# Patient Record
Sex: Female | Born: 1950 | Race: Asian | Hispanic: No
Health system: Southern US, Community
[De-identification: ages and names within clinical notes are randomized; demographics above are authoritative.]

## PROBLEM LIST (undated history)

## (undated) DIAGNOSIS — I10 Essential (primary) hypertension: Secondary | ICD-10-CM

## (undated) DIAGNOSIS — E119 Type 2 diabetes mellitus without complications: Secondary | ICD-10-CM

---

## 2019-03-20 ENCOUNTER — Emergency Department: Payer: PRIVATE HEALTH INSURANCE

## 2019-03-20 ENCOUNTER — Encounter: Payer: Self-pay | Admitting: Radiology

## 2019-03-20 ENCOUNTER — Other Ambulatory Visit: Payer: Self-pay

## 2019-03-20 ENCOUNTER — Ambulatory Visit (HOSPITAL_COMMUNITY)
Admission: EM | Admit: 2019-03-20 | Discharge: 2019-03-20 | Disposition: A | Discharge status: Home / Self Care | Payer: Self-pay

## 2019-03-20 ENCOUNTER — Emergency Department
Admission: EM | Admit: 2019-03-20 | Discharge: 2019-03-21 | Disposition: A | Discharge status: Home / Self Care | Payer: PRIVATE HEALTH INSURANCE | Attending: Emergency Medicine | Admitting: Emergency Medicine

## 2019-03-20 DIAGNOSIS — R42 Dizziness and giddiness: Secondary | ICD-10-CM | POA: Diagnosis not present

## 2019-03-20 DIAGNOSIS — N390 Urinary tract infection, site not specified: Secondary | ICD-10-CM | POA: Diagnosis not present

## 2019-03-20 DIAGNOSIS — H539 Unspecified visual disturbance: Secondary | ICD-10-CM | POA: Insufficient documentation

## 2019-03-20 DIAGNOSIS — R112 Nausea with vomiting, unspecified: Secondary | ICD-10-CM | POA: Diagnosis not present

## 2019-03-20 DIAGNOSIS — R51 Headache: Secondary | ICD-10-CM | POA: Diagnosis not present

## 2019-03-20 DIAGNOSIS — E119 Type 2 diabetes mellitus without complications: Secondary | ICD-10-CM | POA: Diagnosis not present

## 2019-03-20 DIAGNOSIS — I10 Essential (primary) hypertension: Secondary | ICD-10-CM | POA: Diagnosis not present

## 2019-03-20 DIAGNOSIS — R519 Headache, unspecified: Secondary | ICD-10-CM

## 2019-03-20 HISTORY — DX: Type 2 diabetes mellitus without complications: E11.9

## 2019-03-20 HISTORY — DX: Essential (primary) hypertension: I10

## 2019-03-20 LAB — BASIC METABOLIC PANEL
Anion gap: 11 (ref 5–15)
BUN: 15 mg/dL (ref 8–23)
CO2: 25 mmol/L (ref 22–32)
Calcium: 9.3 mg/dL (ref 8.9–10.3)
Chloride: 96 mmol/L — ABNORMAL LOW (ref 98–111)
Creatinine, Ser: 0.86 mg/dL (ref 0.44–1.00)
GFR calc Af Amer: >60 mL/min (ref >60)
GFR calc non Af Amer: >60 mL/min (ref >60)
Glucose, Bld: 141 mg/dL — ABNORMAL HIGH (ref 70–99)
Potassium: 4.1 mmol/L (ref 3.5–5.1)
Sodium: 132 mmol/L — ABNORMAL LOW (ref 135–145)

## 2019-03-20 LAB — URINALYSIS, COMPLETE (UACMP) WITH MICROSCOPIC
Bacteria, UA: NONE SEEN
Bilirubin Urine: NEGATIVE
Glucose, UA: NEGATIVE mg/dL
Hgb urine dipstick: NEGATIVE
Ketones, ur: NEGATIVE mg/dL
Nitrite: NEGATIVE
Protein, ur: NEGATIVE mg/dL
Specific Gravity, Urine: 1.009 (ref 1.005–1.030)
pH: 7 (ref 5.0–8.0)

## 2019-03-20 LAB — CBC
HCT: 32.2 % — ABNORMAL LOW (ref 36.0–46.0)
Hemoglobin: 10.3 g/dL — ABNORMAL LOW (ref 12.0–15.0)
MCH: 25.9 pg — ABNORMAL LOW (ref 26.0–34.0)
MCHC: 32 g/dL (ref 30.0–36.0)
MCV: 80.9 fL (ref 80.0–100.0)
Platelets: 253 10*3/uL (ref 150–400)
RBC: 3.98 MIL/uL (ref 3.87–5.11)
RDW: 12.7 % (ref 11.5–15.5)
WBC: 11.6 10*3/uL — ABNORMAL HIGH (ref 4.0–10.5)
nRBC: 0 % (ref 0.0–0.2)

## 2019-03-20 LAB — GLUCOSE, CAPILLARY: Glucose-Capillary: 120 mg/dL — ABNORMAL HIGH (ref 70–99)

## 2019-03-20 MED ORDER — CEPHALEXIN 500 MG PO CAPS: 500.0000 mg | ORAL_CAPSULE | Freq: Two times a day (BID) | ORAL | 0 refills | Status: AC

## 2019-03-20 MED ORDER — IOHEXOL 350 MG/ML SOLN
75.0000 mL | Freq: Once | INTRAVENOUS | Status: AC | PRN
  Administered 2019-03-20: 75 mL via INTRAVENOUS

## 2019-03-20 MED ORDER — METOCLOPRAMIDE HCL 5 MG/ML IJ SOLN
10.0000 mg | Freq: Once | INTRAMUSCULAR | Status: AC
  Administered 2019-03-20: 10 mg via INTRAVENOUS
  Filled 2019-03-20: qty 2

## 2019-03-20 NOTE — ED Notes (Signed)
Patient given warm blanket and placed on monitor.

## 2019-03-20 NOTE — ED Triage Notes (Signed)
Patient with complaint of dizziness, right sided headache and double vision in left eye that started yesterday.

## 2019-03-20 NOTE — ED Provider Notes (Signed)
[facility] Emergency Department Provider Note ____________________________________________   First MD Initiated Contact with Patient 03/20/19 2144     (approximate)  I have reviewed the triage vital signs and the nursing notes.   HISTORY  Chief Complaint Dizziness  History of present illness obtained via interpretation by the patient's daughter, per the patient's request  HPI Lindsey Olson is a 68 y.o. female with a history of hypertension who presents with headache, gradual onset and intermittent course since yesterday, and mainly occurring on the left side of her head.  It is been associated with intermittent blurred vision and double vision involving the right eye.  The patient denies any symptoms with the left eye.  She states that she had a similar episode several years back which resolved on its own.  She denies any trauma to the head.  She has had no significant nausea or vomiting except for 1 episode yesterday after she felt carsick while being in a vehicle.  She denies any fever, chest pain, difficulty breathing, neck stiffness, or any other acute symptoms.  Per the daughter, the patient moved here from [place] in January.  She plans to return to [place] but this has been delayed due to COVID-19.  Past Medical History:  Diagnosis Date  . Diabetes mellitus without complication (HCC)   . Hypertension     There are no active problems to display for this patient.     Prior to Admission medications   Not on File    Allergies Other  No family history on file.  Social History Social History   Tobacco Use  . Smoking status: Not on file  Substance Use Topics  . Alcohol use: Not on file  . Drug use: Not on file    Review of Systems  Constitutional: No fever. Eyes: Positive for intermittent blurred vision and diplopia. ENT: No neck pain. Cardiovascular: Denies chest pain. Respiratory: Denies shortness of breath. Gastrointestinal: No  vomiting or diarrhea Genitourinary: Negative for dysuria.  Musculoskeletal: Negative for back pain. Skin: Negative for rash. Neurological: Positive for headaches, negative for focal weakness or numbness.   ____________________________________________   PHYSICAL EXAM:  VITAL SIGNS: ED Triage Vitals  Enc Vitals Group     BP 03/20/19 1939 (!) 153/51     Pulse Rate 03/20/19 1939 62     Resp 03/20/19 1939 18     Temp 03/20/19 1939 98.3 F (36.8 C)     Temp Source 03/20/19 1939 Oral     SpO2 03/20/19 1939 97 %     Weight 03/20/19 1938 149 lb 14.6 oz (68 kg)     Height 03/20/19 1938  (1.499 m)     Head Circumference --      Peak Flow --      Pain Score 03/20/19 1937 3     Pain Loc --      Pain Edu? --      Excl. in GC? --     Constitutional: Alert and oriented. Well appearing and in no acute distress. Eyes: Conjunctivae are normal.  EOMI.  PERRLA.  No nystagmus. Head: Atraumatic. Nose: No congestion/rhinnorhea. Mouth/Throat: Mucous membranes are moist.   Neck: Normal range of motion.  Supple.  No meningeal signs. Cardiovascular: Good peripheral circulation. Respiratory: Normal respiratory effort.   Gastrointestinal:  No distention.  Musculoskeletal: Extremities warm and well perfused.  Neurologic:  Normal speech and language.  No facial droop.  5/5 motor strength and intact sensation to all extremities.  Normal coordination with no ataxia on finger-to-nose.  No pronator drift.   Skin:  Skin is warm and dry. No rash noted. Psychiatric: Mood and affect are normal. Speech and behavior are normal.  ____________________________________________   LABS (all labs ordered are listed, but only abnormal results are displayed)  Labs Reviewed  BASIC METABOLIC PANEL - Abnormal; Notable for the following components:      Result Value   Sodium 132 (*)    Chloride 96 (*)    Glucose, Bld 141 (*)    All other components within normal limits  CBC - Abnormal; Notable for the  following components:   WBC 11.6 (*)    Hemoglobin 10.3 (*)    HCT 32.2 (*)    MCH 25.9 (*)    All other components within normal limits  URINALYSIS, COMPLETE (UACMP) WITH MICROSCOPIC - Abnormal; Notable for the following components:   Color, Urine YELLOW (*)    APPearance CLEAR (*)    Leukocytes,Ua MODERATE (*)    All other components within normal limits  GLUCOSE, CAPILLARY - Abnormal; Notable for the following components:   Glucose-Capillary 120 (*)    All other components within normal limits   ____________________________________________  EKG  ED ECG REPORT I, Dionne BucySebastian Linnet Bottari, the attending physician, personally viewed and interpreted this ECG.  Date: 03/20/2019 EKG Time: 1956 Rate: 59 Rhythm: normal sinus rhythm QRS Axis: normal Intervals: normal ST/T Wave abnormalities: normal Narrative Interpretation: no evidence of acute ischemia  ____________________________________________  RADIOLOGY  CT angio head: Pending  ____________________________________________   PROCEDURES  Procedure(s) performed: No  Procedures  Critical Care performed: No ____________________________________________   INITIAL IMPRESSION / ASSESSMENT AND PLAN / ED COURSE  Pertinent labs & imaging results that were available during my care of the patient were reviewed by me and considered in my medical decision making (see chart for details).  68 year old female with a history of hypertension presents with intermittent headache over the last day which is left-sided and associated with intermittent blurred vision and diplopia in the right eye.  The patient denies any involvement of the left eye.  She reports one prior similar episode several years ago which resolved on its own.  On exam the patient is well-appearing.  Her vital signs are normal except for hypertension.  Neurologic exam is normal and the remainder of the exam is unremarkable.  The patient has no prior medical records here  as she recently moved from [place].  Overall I suspect most likely benign etiology such as migraine, scintillating scotoma, or tension type headache.  Given the patient's age and the unilateral eye involvement I will obtain CT head as well as CT angiogram to evaluate for any vascular etiology although clinically my suspicion for aneurysm or SAH is extremely low.  The patient has no findings to suggest meningitis.  I will treat symptomatically for migraine.  If the lab work-up and imaging are negative anticipate discharge home.  ----------------------------------------- 11:14 PM on 03/20/2019 -----------------------------------------  Lab work-up is unremarkable except for UA consistent with possible UTI and slightly elevated WBC count.  I will treat empirically with a course of antibiotic.  CT is pending and I am signing the patient out to the oncoming physician Dr. Manson PasseyBrown.  ____________________________________________   FINAL CLINICAL IMPRESSION(S) / ED DIAGNOSES  Final diagnoses:  Acute nonintractable headache, unspecified headache type  Visual disturbance  Urinary tract infection without hematuria, site unspecified      NEW MEDICATIONS STARTED DURING THIS VISIT:  New Prescriptions  No medications on file     Note:  This document was prepared using Dragon voice recognition software and may include unintentional dictation errors.   Dionne Bucy, MD 03/20/19 2315

## 2019-03-20 NOTE — ED Notes (Signed)
Patient still complaining of blurred vision

## 2019-03-20 NOTE — ED Triage Notes (Addendum)
Daughter states symptoms started Wednesday worse yesterday with right sided headache, dizziness and blurred/double vision.  Info obtained via daughter and [address] [place], #[id number].  Patient speaks [place].

## 2019-03-21 ENCOUNTER — Emergency Department: Payer: PRIVATE HEALTH INSURANCE

## 2019-03-21 MED ORDER — GADOBUTROL 1 MMOL/ML IV SOLN
6.0000 mL | Freq: Once | INTRAVENOUS | Status: AC | PRN
  Administered 2019-03-21: 6 mL via INTRAVENOUS

## 2019-03-21 MED ORDER — CARBOXYMETHYLCELLULOSE SODIUM 1 % OP SOLN: 1.0000 [drp] | Freq: Three times a day (TID) | OPHTHALMIC | 4 refills | Status: AC

## 2019-03-21 NOTE — ED Provider Notes (Addendum)
Patient signed out to me by Dr. Cherylann Banas with recommendation to follow-up on CT angiogram of the head which was unremarkable.  MRI of the brain also revealed no evidence of intracranial pathology.  Patient denies any headache or any symptoms at present.   Lindsey Hams, MD 03/21/19 [name]    Lindsey Hams, MD 03/21/19 [phone]

## 2019-03-27 ENCOUNTER — Other Ambulatory Visit
Admission: RE | Admit: 2019-03-27 | Discharge: 2019-03-27 | Disposition: A | Discharge status: Home / Self Care | Payer: PRIVATE HEALTH INSURANCE | Source: Ambulatory Visit | Attending: Ophthalmology | Admitting: Ophthalmology

## 2019-03-27 DIAGNOSIS — H4922 Sixth [abducent] nerve palsy, left eye: Secondary | ICD-10-CM | POA: Diagnosis present

## 2019-03-27 LAB — C-REACTIVE PROTEIN: CRP: <0.8 mg/dL (ref <1.0)

## 2019-03-27 LAB — SEDIMENTATION RATE: Sed Rate: 51 mm/hr — ABNORMAL HIGH (ref 0–30)

## 2019-12-15 IMAGING — MR MRI HEAD WITHOUT AND WITH CONTRAST
12 of 13 series · 45 of 48 positions shown · IV contrast (gadavist)
Comparison: Head CT 03/20/2019

CLINICAL DATA: Headache

EXAM:
MRI HEAD WITHOUT AND WITH CONTRAST
TECHNIQUE: Multiplanar, multiecho pulse sequences of the brain and surrounding
structures were obtained without and with intravenous contrast.
CONTRAST:  6 mL Gadavist

[Series 2: ax dwi_tracew · axial · 3.0mm · 0.83mm/px · z∈[-84,+78]mm · 5 of 55 slices shown]
[im 1/55]
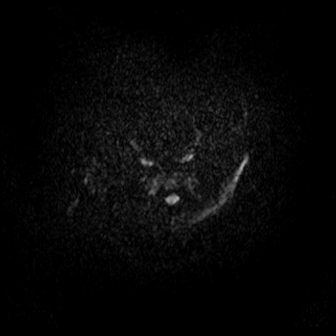
[im 14/55]
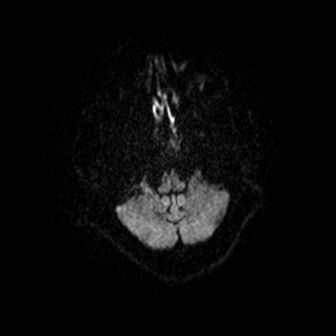
[im 28/55]
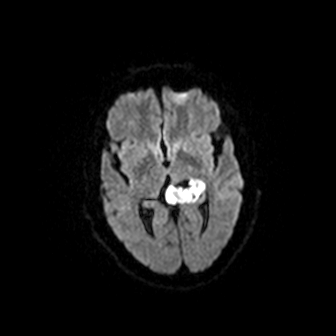
[im 41/55]
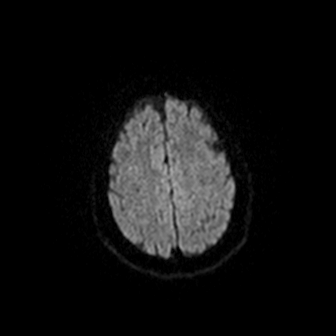
[im 55/55]
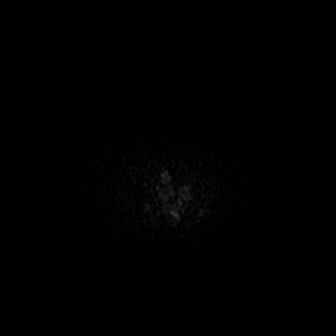

[Series 3: ax dwi_adc · axial · 3.0mm · 0.83mm/px · z∈[-84,+78]mm · 6 of 55 slices shown]
[im 1/55]
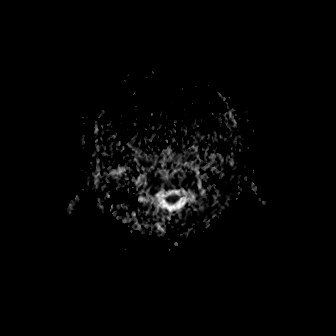
[im 11/55]
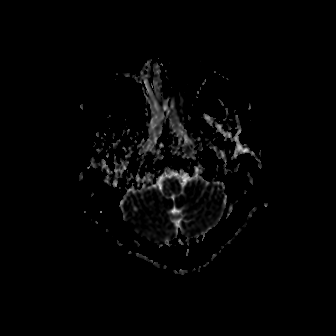
[im 22/55]
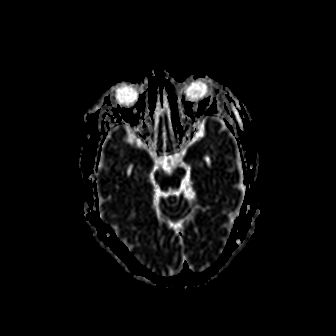
[im 33/55]
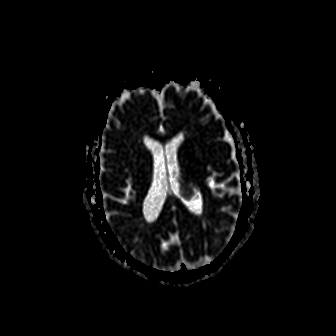
[im 44/55]
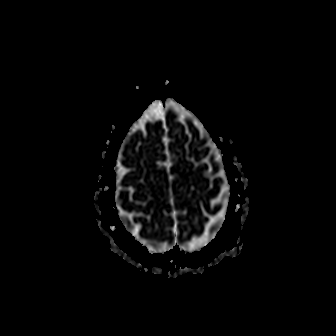
[im 55/55]
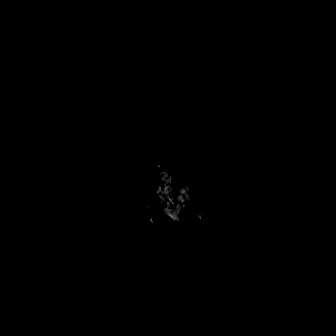

[Series 4: cor dwi_tracew · coronal · 5.0mm · 0.68mm/px · 5 of 41 slices shown]
[im 1/41]
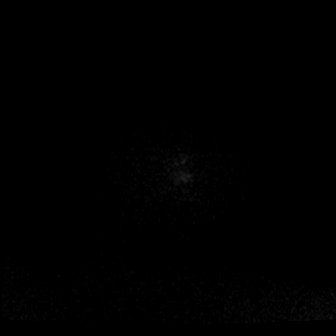
[im 11/41]
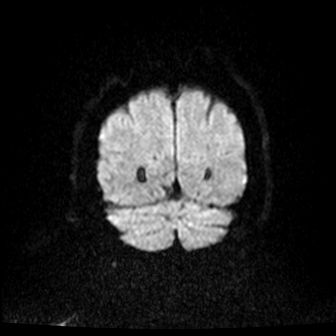
[im 21/41]
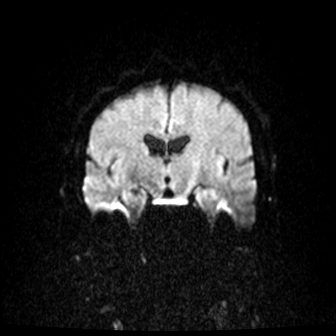
[im 31/41]
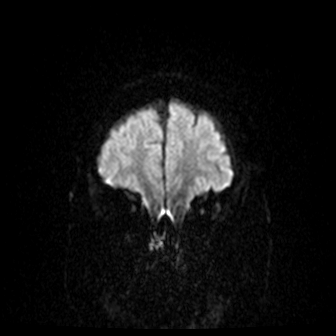
[im 41/41]
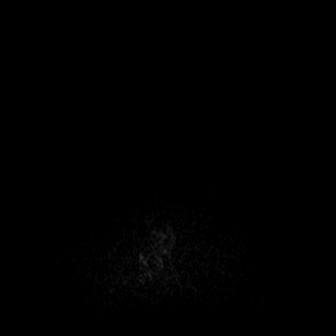

[Series 5: cor dwi_adc · coronal · 5.0mm · 0.68mm/px · 5 of 41 slices shown]
[im 1/41]
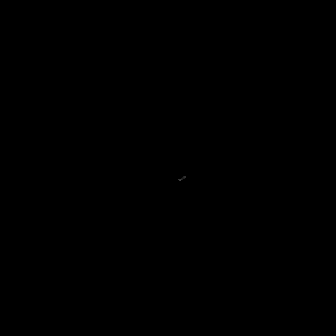
[im 11/41]
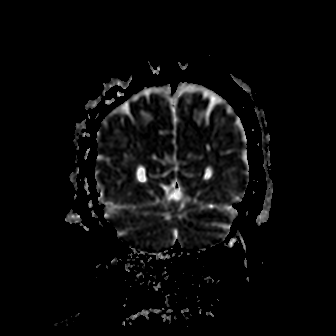
[im 21/41]
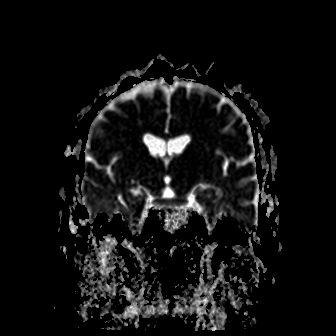
[im 31/41]
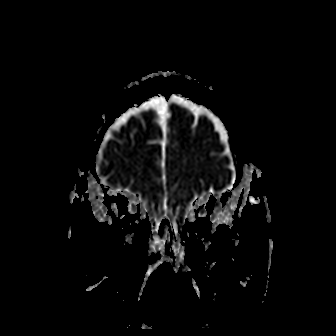
[im 41/41]
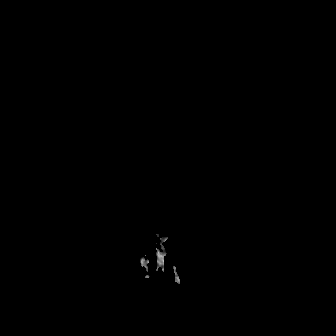

[Series 6: T1 · sagittal · 5.0mm · 0.94mm/px · 3 of 23 slices shown (1 of 3)]
[im 1/23]
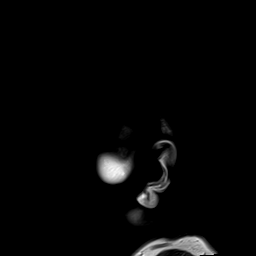
[im 12/23]
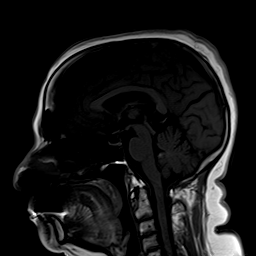
[im 23/23]
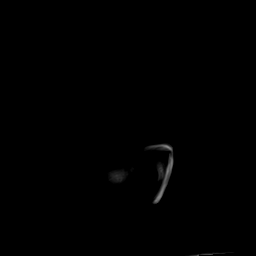

[Series 7: T2 · axial · 5.0mm · 0.45mm/px · z∈[-81,+75]mm · 3 of 27 slices shown (1 of 2)]
[im 1/27]
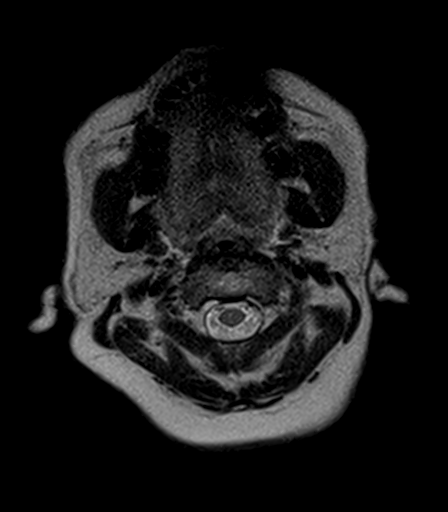
[im 14/27]
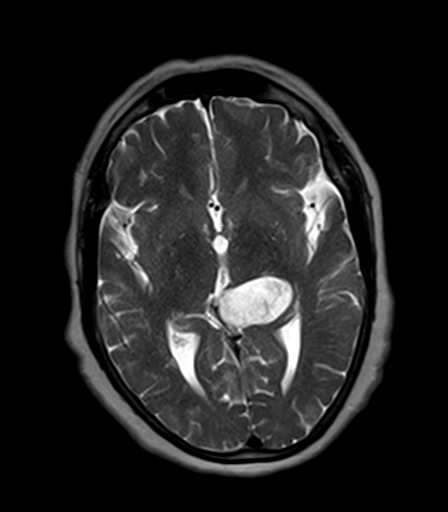
[im 27/27]
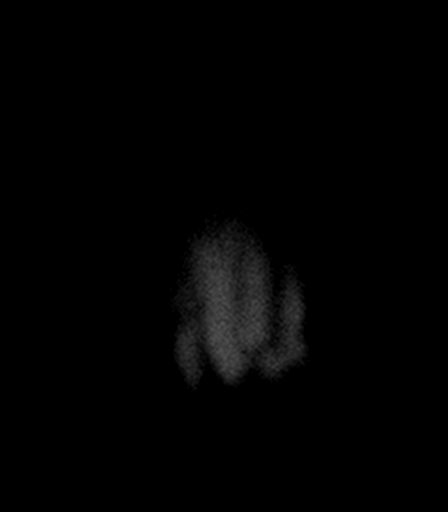

[Series 9: FLAIR · axial · 5.0mm · 1.20mm/px · z∈[-81,+75]mm · 3 of 27 slices shown]
[im 1/27]
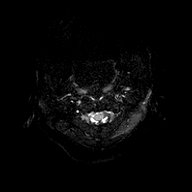
[im 14/27]
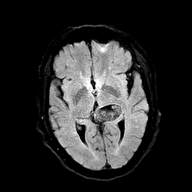
[im 27/27]
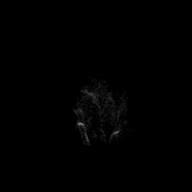

[Series 10: T1 · axial · 5.0mm · 0.90mm/px · z∈[-81,+75]mm · 3 of 27 slices shown (2 of 3)]
[im 1/27]
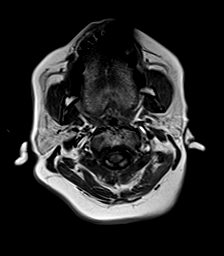
[im 14/27]
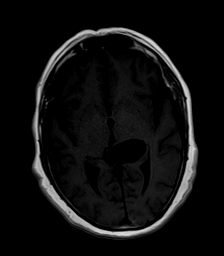
[im 27/27]
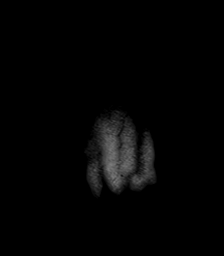

[Series 11: T2 · coronal · 5.0mm · 0.45mm/px · 3 of 31 slices shown (2 of 2)]
[im 1/31]
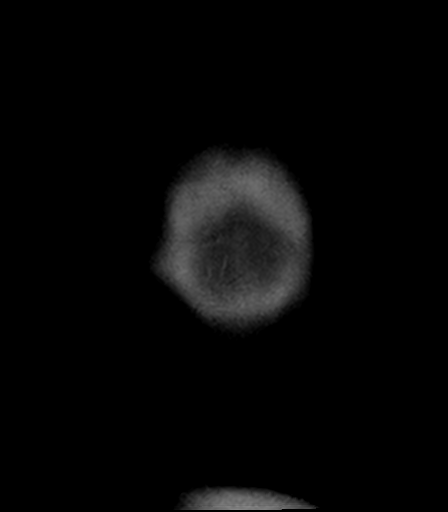
[im 16/31]
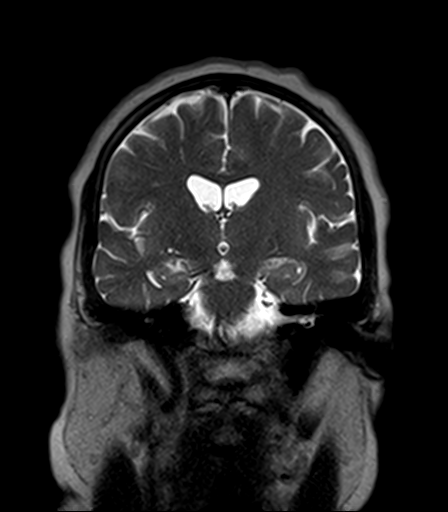
[im 31/31]
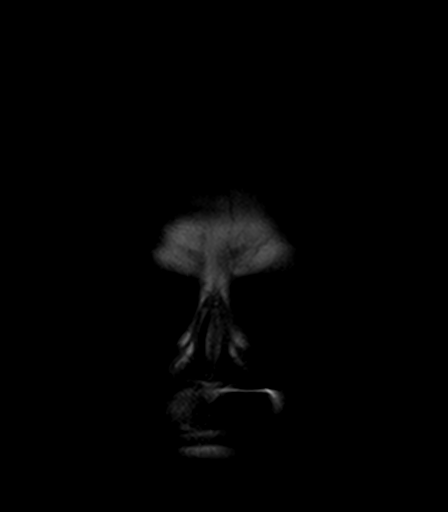

[Series 12: T1 post-contrast · axial · 5.0mm · 0.90mm/px · z∈[-81,+75]mm · 3 of 27 slices shown (1 of 2)]
[im 1/27]
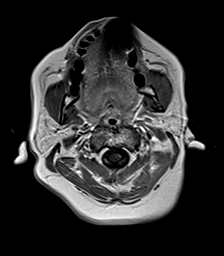
[im 14/27]
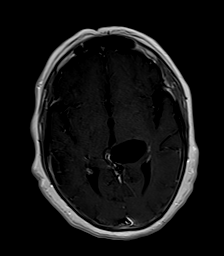
[im 27/27]
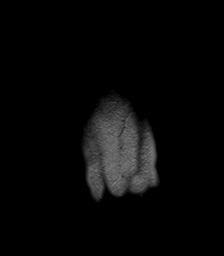

[Series 13: T1 post-contrast · coronal · 5.0mm · 0.90mm/px · 3 of 31 slices shown (2 of 2)]
[im 1/31]
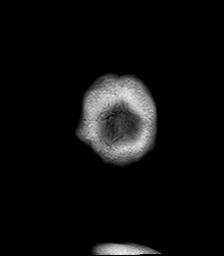
[im 16/31]
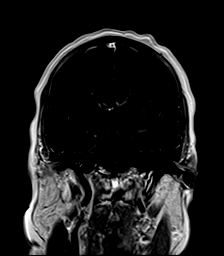
[im 31/31]
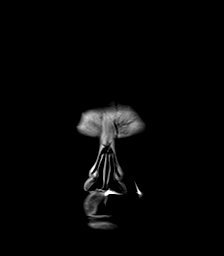

[Series 14: T1 · sagittal · 5.0mm · 0.94mm/px · 3 of 23 slices shown (3 of 3)]
[im 1/23]
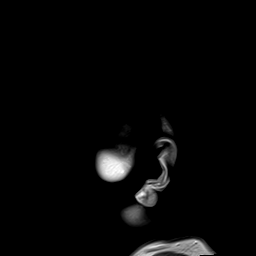
[im 12/23]
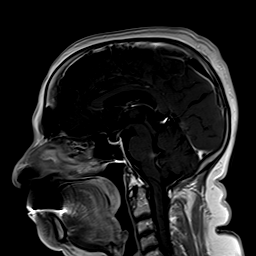
[im 23/23]
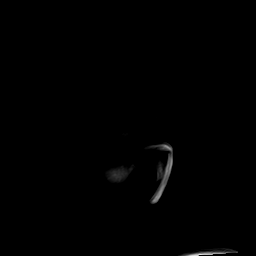

[45 of 48 positions shown; findings below may reference images not displayed]

FINDINGS: BRAIN: There is no acute infarct, acute hemorrhage or extra-axial
collection. There is an intraventricular epidermoid that measures
3.4 x 1.6 cm. No midline shift or other mass effect. Multifocal
white matter hyperintensity, most commonly due to chronic ischemic
microangiopathy. The cerebral and cerebellar volume are
age-appropriate. No hydrocephalus. Susceptibility-sensitive
sequences show no chronic microhemorrhage or superficial siderosis.
No mass lesion.

VASCULAR: The major intracranial arterial and venous sinus flow
voids are normal.

SKULL AND UPPER CERVICAL SPINE: Calvarial bone marrow signal is
normal. There is no skull base mass. Visualized upper cervical spine
and soft tissues are normal.

SINUSES/ORBITS: No fluid levels or advanced mucosal thickening. No
mastoid or middle ear effusion. The orbits are normal.
IMPRESSION: 1. No acute intracranial abnormality.
2. Intraventricular epidermoid cyst centered in the left lateral
ventricle. No hydrocephalus or other evidence of obstructed CSF
flow.
